# Patient Record
Sex: Male | Born: 1940 | Race: White | Hispanic: No | Marital: Single
Health system: Southern US, Community
[De-identification: ages and names within clinical notes are randomized; demographics above are authoritative.]

## PROBLEM LIST (undated history)

## (undated) DIAGNOSIS — J449 Chronic obstructive pulmonary disease, unspecified: Secondary | ICD-10-CM

## (undated) DIAGNOSIS — J849 Interstitial pulmonary disease, unspecified: Secondary | ICD-10-CM

## (undated) DIAGNOSIS — I5030 Unspecified diastolic (congestive) heart failure: Secondary | ICD-10-CM

## (undated) DIAGNOSIS — C189 Malignant neoplasm of colon, unspecified: Secondary | ICD-10-CM

## (undated) DIAGNOSIS — I4891 Unspecified atrial fibrillation: Secondary | ICD-10-CM

---

## 2015-05-31 ENCOUNTER — Inpatient Hospital Stay
Admission: RE | Admit: 2015-05-31 | Discharge: 2015-07-07 | Disposition: E | Payer: Self-pay | Source: Other Acute Inpatient Hospital | Attending: Internal Medicine | Admitting: Internal Medicine

## 2015-05-31 ENCOUNTER — Other Ambulatory Visit (HOSPITAL_COMMUNITY): Payer: Self-pay

## 2015-05-31 DIAGNOSIS — J189 Pneumonia, unspecified organism: Secondary | ICD-10-CM

## 2015-05-31 DIAGNOSIS — J9690 Respiratory failure, unspecified, unspecified whether with hypoxia or hypercapnia: Secondary | ICD-10-CM

## 2015-05-31 DIAGNOSIS — J9601 Acute respiratory failure with hypoxia: Secondary | ICD-10-CM | POA: Insufficient documentation

## 2015-05-31 DIAGNOSIS — J8410 Pulmonary fibrosis, unspecified: Secondary | ICD-10-CM | POA: Insufficient documentation

## 2015-05-31 HISTORY — DX: Interstitial pulmonary disease, unspecified: J84.9

## 2015-05-31 HISTORY — DX: Unspecified atrial fibrillation: I48.91

## 2015-05-31 HISTORY — DX: Malignant neoplasm of colon, unspecified: C18.9

## 2015-05-31 HISTORY — DX: Chronic obstructive pulmonary disease, unspecified: J44.9

## 2015-05-31 HISTORY — DX: Unspecified diastolic (congestive) heart failure: I50.30

## 2015-06-01 LAB — BLOOD GAS, ARTERIAL
ACID-BASE EXCESS: 5.7 mmol/L — AB (ref 0.0–2.0)
Bicarbonate: 29.1 mEq/L — ABNORMAL HIGH (ref 20.0–24.0)
DELIVERY SYSTEMS: POSITIVE
DRAWN BY: 345601
EXPIRATORY PAP: 8
FIO2: 0.7
INSPIRATORY PAP: 16
O2 SAT: 84.1 %
PCO2 ART: 36.1 mmHg (ref 35.0–45.0)
PH ART: 7.513 — AB (ref 7.350–7.450)
PO2 ART: 46.2 mmHg — AB (ref 80.0–100.0)
Patient temperature: 96.8
TCO2: 30.3 mmol/L (ref 0–100)

## 2015-06-01 LAB — CBC WITH DIFFERENTIAL/PLATELET
Basophils Absolute: 0 10*3/uL (ref 0.0–0.1)
Basophils Relative: 0 %
EOS ABS: 0 10*3/uL (ref 0.0–0.7)
EOS PCT: 0 %
HCT: 37.4 % — ABNORMAL LOW (ref 39.0–52.0)
Hemoglobin: 11.8 g/dL — ABNORMAL LOW (ref 13.0–17.0)
LYMPHS ABS: 0.7 10*3/uL (ref 0.7–4.0)
Lymphocytes Relative: 6 %
MCH: 29.3 pg (ref 26.0–34.0)
MCHC: 31.6 g/dL (ref 30.0–36.0)
MCV: 92.8 fL (ref 78.0–100.0)
Monocytes Absolute: 0.5 10*3/uL (ref 0.1–1.0)
Monocytes Relative: 4 %
Neutro Abs: 11 10*3/uL — ABNORMAL HIGH (ref 1.7–7.7)
Neutrophils Relative %: 90 %
PLATELETS: 231 10*3/uL (ref 150–400)
RBC: 4.03 MIL/uL — AB (ref 4.22–5.81)
RDW: 21.7 % — ABNORMAL HIGH (ref 11.5–15.5)
WBC: 12 10*3/uL — AB (ref 4.0–10.5)

## 2015-06-01 LAB — URINALYSIS, ROUTINE W REFLEX MICROSCOPIC
BILIRUBIN URINE: NEGATIVE
Glucose, UA: NEGATIVE mg/dL
HGB URINE DIPSTICK: NEGATIVE
KETONES UR: NEGATIVE mg/dL
Leukocytes, UA: NEGATIVE
Nitrite: NEGATIVE
PROTEIN: NEGATIVE mg/dL
Specific Gravity, Urine: 1.01 (ref 1.005–1.030)
pH: 7 (ref 5.0–8.0)

## 2015-06-01 LAB — TSH: TSH: 0.833 u[IU]/mL (ref 0.350–4.500)

## 2015-06-01 LAB — PROTIME-INR
INR: 1.3 (ref 0.00–1.49)
Prothrombin Time: 16.3 seconds — ABNORMAL HIGH (ref 11.6–15.2)

## 2015-06-01 LAB — BRAIN NATRIURETIC PEPTIDE: B NATRIURETIC PEPTIDE 5: 165.5 pg/mL — AB (ref 0.0–100.0)

## 2015-06-01 LAB — PROCALCITONIN

## 2015-06-02 LAB — COMPREHENSIVE METABOLIC PANEL
ALBUMIN: 3.2 g/dL — AB (ref 3.5–5.0)
ALK PHOS: 70 U/L (ref 38–126)
ALT: 29 U/L (ref 17–63)
AST: 34 U/L (ref 15–41)
Anion gap: 12 (ref 5–15)
BILIRUBIN TOTAL: 1.4 mg/dL — AB (ref 0.3–1.2)
BUN: 35 mg/dL — AB (ref 6–20)
CALCIUM: 8.8 mg/dL — AB (ref 8.9–10.3)
CO2: 30 mmol/L (ref 22–32)
Chloride: 99 mmol/L — ABNORMAL LOW (ref 101–111)
Creatinine, Ser: 0.79 mg/dL (ref 0.61–1.24)
GFR calc Af Amer: >60 mL/min (ref >60)
GLUCOSE: 180 mg/dL — AB (ref 65–99)
POTASSIUM: 4.1 mmol/L (ref 3.5–5.1)
Sodium: 141 mmol/L (ref 135–145)
TOTAL PROTEIN: 6.1 g/dL — AB (ref 6.5–8.1)

## 2015-06-02 LAB — MAGNESIUM: Magnesium: 2.5 mg/dL — ABNORMAL HIGH (ref 1.7–2.4)

## 2015-06-02 LAB — HEMOGLOBIN A1C
HEMOGLOBIN A1C: 5.5 % (ref 4.8–5.6)
MEAN PLASMA GLUCOSE: 111 mg/dL

## 2015-06-02 LAB — PHOSPHORUS: PHOSPHORUS: 3.7 mg/dL (ref 2.5–4.6)

## 2015-06-02 LAB — PROCALCITONIN: Procalcitonin: <0.1 ng/mL

## 2015-06-03 ENCOUNTER — Encounter: Payer: Self-pay | Admitting: Pulmonary Disease

## 2015-06-03 DIAGNOSIS — J9601 Acute respiratory failure with hypoxia: Secondary | ICD-10-CM

## 2015-06-03 DIAGNOSIS — J8410 Pulmonary fibrosis, unspecified: Secondary | ICD-10-CM | POA: Insufficient documentation

## 2015-06-03 NOTE — Consult Note (Signed)
Name: William Hensley MRN: [id number] DOB: 1940-10-24    ADMISSION DATE:  06/06/2015 CONSULTATION DATE:  06/03/2015  REFERRING MD :  Dr. Laren Everts  CHIEF COMPLAINT:  SOB  BRIEF PATIENT DESCRIPTION: 75 year old male with PMH interstitial lung disease managed by [facility]. [facility]. On 3L O2 at baseline, no chronic steroids. At baseline could walk ~40 feet before having to rest. Recently admitted to [facility]) for hypoxemic respiratory failure multifactoiral in setting COPD exacerbation, ILD flare, and volume overload. He required CPAP temporarily. He was treated with bronchodilators, steroids, and diuresis. Also short course of broad ABX to rule out CAP. Volume status improved, however, he remained severely hypoxemic despite continued nebs and steroids. He was transferred to [facility] for additional recovery.  SIGNIFICANT EVENTS  3/20 admit to [facility]  3/25 discharge to [facility] on [facility] 3/28 Pulmonary consult  STUDIES:    PAST MEDICAL HISTORY :   has a past medical history of COPD (chronic obstructive pulmonary disease) ([place]); ILD (interstitial lung disease) ([place]); Diastolic CHF ([place]); Atrial fibrillation ([place]); and Colon cancer ([place]).  has no past surgical history on file. Prior to Admission medications   Not on File   Allergies not on file  FAMILY HISTORY:  family history is not on file. SOCIAL HISTORY:    REVIEW OF SYSTEMS:   Bolds are positive  Constitutional: weight loss, gain, night sweats, Fevers, chills, fatigue .  HEENT: headaches, Sore throat, sneezing, nasal congestion, post nasal drip, Difficulty swallowing, Tooth/dental problems, visual complaints visual changes, ear ache CV:  chest pain, radiates: ,Orthopnea, PND, swelling in lower extremities, dizziness, palpitations, syncope.  GI  heartburn, indigestion, abdominal pain, nausea, vomiting, diarrhea, change in bowel habits, loss of appetite, bloody stools.  Resp: cough, productive: ,  hemoptysis, dyspnea, chest pain, pleuritic.  Skin: rash or itching or icterus GU: dysuria, change in color of urine, urgency or frequency. flank pain, hematuria  MS: joint pain or swelling. decreased range of motion  Psych: change in mood or affect. depression or anxiety.  Neuro: difficulty with speech, weakness, numbness, ataxia   SUBJECTIVE:   VITAL SIGNS:  P86, R 24, 82% on 100% FiO2 via HFNC  PHYSICAL EXAMINATION: General: Elderly male of normal body habitus in NAD at rest on 100% HFNC Neuro:  Alert, oriented, non-focal HEENT:  [place]/AT, PERRL, no JVD Cardiovascular:  RRR, no MRG Lungs:  Coarse bilateral breath sounds. Satting low 80s on 100% HFNC Abdomen:  Soft, non-tender, nondistended Musculoskeletal:  No acute deformity, trace if any lower extremity edema.  Skin:  Grossly intact   Recent Labs Lab 06/02/15 0530  NA 141  K 4.1  CL 99*  CO2 30  BUN 35*  CREATININE 0.79  GLUCOSE 180*    Recent Labs Lab 06/01/15 0544  HGB 11.8*  HCT 37.4*  WBC 12.0*  PLT 231   No results found.  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 Between COPD and ILD I fear that he may be at his new baseline and unable to recover his former ability to oxygenate. Despite aggressive diuresis, antibiotics, steroids, and bronchodilators he has been unable to improve. I think at this point he is a poor candidate for aggressive measures such as ACLS/intubation or other aggressive measures. It is unlikely that he would be able to wean off of ventilator in the setting of worsening respiratory failure or cardiac arrest. We will help to maximize his therapy. Mr.  Hensley seems to want to go home, I think this would be possible via home hospice assuming he can get HFNC at home.   Plan: - Continue HFNC with goal SpO2 88 - 92% (not able to reach this on 100% and 40LPM) - Continue budesonide, brovana, duoneb - Start systemic steroids - Continue  diuresis as tolerated - Further goals of care conversations  Georgann Housekeeper, AGACNP-BC [facility] Pulmonology/Critical Care Pager [phone] or [phone]  06/03/2015 4:43 PM

## 2015-06-04 ENCOUNTER — Other Ambulatory Visit (HOSPITAL_COMMUNITY): Payer: Self-pay

## 2015-06-04 LAB — PROCALCITONIN: Procalcitonin: 0.1 ng/mL

## 2015-06-05 DIAGNOSIS — K922 Gastrointestinal hemorrhage, unspecified: Secondary | ICD-10-CM | POA: Insufficient documentation

## 2015-06-05 DIAGNOSIS — I4891 Unspecified atrial fibrillation: Secondary | ICD-10-CM | POA: Insufficient documentation

## 2015-06-05 DIAGNOSIS — A0472 Enterocolitis due to Clostridium difficile, not specified as recurrent: Secondary | ICD-10-CM | POA: Insufficient documentation

## 2015-06-05 DIAGNOSIS — I25810 Atherosclerosis of coronary artery bypass graft(s) without angina pectoris: Secondary | ICD-10-CM | POA: Insufficient documentation

## 2015-06-05 DIAGNOSIS — N179 Acute kidney failure, unspecified: Secondary | ICD-10-CM | POA: Insufficient documentation

## 2015-06-05 DIAGNOSIS — N390 Urinary tract infection, site not specified: Secondary | ICD-10-CM | POA: Insufficient documentation

## 2015-06-05 DIAGNOSIS — J690 Pneumonitis due to inhalation of food and vomit: Secondary | ICD-10-CM | POA: Insufficient documentation

## 2015-06-05 DIAGNOSIS — Z515 Encounter for palliative care: Secondary | ICD-10-CM

## 2015-06-05 DIAGNOSIS — J810 Acute pulmonary edema: Secondary | ICD-10-CM

## 2015-06-05 DIAGNOSIS — R451 Restlessness and agitation: Secondary | ICD-10-CM | POA: Insufficient documentation

## 2015-06-05 LAB — COMPREHENSIVE METABOLIC PANEL
ALBUMIN: 2.8 g/dL — AB (ref 3.5–5.0)
ALK PHOS: 72 U/L (ref 38–126)
ALT: 22 U/L (ref 17–63)
ANION GAP: 11 (ref 5–15)
AST: 34 U/L (ref 15–41)
BUN: 33 mg/dL — ABNORMAL HIGH (ref 6–20)
CALCIUM: 8.7 mg/dL — AB (ref 8.9–10.3)
CHLORIDE: 97 mmol/L — AB (ref 101–111)
CO2: 29 mmol/L (ref 22–32)
Creatinine, Ser: 0.61 mg/dL (ref 0.61–1.24)
GFR calc non Af Amer: >60 mL/min (ref >60)
GLUCOSE: 109 mg/dL — AB (ref 65–99)
Potassium: 3.8 mmol/L (ref 3.5–5.1)
SODIUM: 137 mmol/L (ref 135–145)
Total Bilirubin: 1.7 mg/dL — ABNORMAL HIGH (ref 0.3–1.2)
Total Protein: 6 g/dL — ABNORMAL LOW (ref 6.5–8.1)

## 2015-06-05 LAB — PHOSPHORUS: Phosphorus: 3.5 mg/dL (ref 2.5–4.6)

## 2015-06-05 LAB — MAGNESIUM: MAGNESIUM: 2.6 mg/dL — AB (ref 1.7–2.4)

## 2015-06-05 NOTE — Progress Notes (Signed)
Name: William Hensley MRN: [id number] DOB: November 08, 1940    ADMISSION DATE:  05/30/2015 CONSULTATION DATE:  06/03/2015  REFERRING MD :  Dr. Laren Everts  CHIEF COMPLAINT:  SOB  BRIEF PATIENT DESCRIPTION: 75 year old male with PMH interstitial lung disease managed by [facility]. On 3L O2 at baseline, no chronic steroids. At baseline could walk ~40 feet before having to rest. Recently admitted to [facility]) for hypoxemic respiratory failure multifactoiral in setting COPD exacerbation, ILD flare, and volume overload. He required CPAP temporarily. He was treated with bronchodilators, steroids, and diuresis. Also short course of broad ABX to rule out CAP. Volume status improved, however, he remained severely hypoxemic despite continued nebs and steroids. He was transferred to [facility] for additional recovery.  SIGNIFICANT EVENTS  3/20 admit to [facility]  3/25 discharge to [facility] on [facility] 3/28 Pulmonary consult 3/30 on 100% fio2 nimvs STUDIES:     SUBJECTIVE:  Awake and alert VITAL SIGNS:  P76, R 24, 92% on 100% FiO2 via NIMVS   PHYSICAL EXAMINATION: General: Elderly male of normal body habitus in NAD at rest on 100% HFNC Neuro:  Alert, oriented, non-focal HEENT:  [place]/AT, PERRL, no JVD Cardiovascular:  RRR, no MRG Lungs:  Coarse bilateral breath sounds. Satting low 80s on 100% HFNC Abdomen:  Soft, non-tender, nondistended Musculoskeletal:  No acute deformity, trace if any lower extremity edema.  Skin:  Grossly intact   Recent Labs Lab 06/02/15 0530 06/05/15 0648  NA 141 137  K 4.1 3.8  CL 99* 97*  CO2 30 29  BUN 35* 33*  CREATININE 0.79 0.61  GLUCOSE 180* 109*    Recent Labs Lab 06/01/15 0544  HGB 11.8*  HCT 37.4*  WBC 12.0*  PLT 231   Dg Chest Port 1 View  06/04/2015  CLINICAL DATA:  Admitted 4 days ago with respiratory failure. EXAM: PORTABLE CHEST 1 VIEW COMPARISON:  05/08/2015 FINDINGS: Bibasilar coarse interstitial opacities.  Advanced apical emphysema. Borderline cardiomegaly with negative aortic and hilar contours. No effusion or air leak. IMPRESSION: *Basilar interstitial opacities may be related to patient's history of pulmonary fibrosis but bilateral pneumonia could be present/superimposed. *Emphysema. Electronically Signed   By: Monte Fantasia M.D.   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 Between COPD and ILD I fear that he may be at his new baseline and unable to recover his former ability to oxygenate. Despite aggressive diuresis, antibiotics, steroids, and bronchodilators he has been unable to improve. I think at this point he is a poor candidate for aggressive measures such as ACLS/intubation or other aggressive measures. It is unlikely that he would be able to wean off of ventilator in the setting of worsening respiratory failure or cardiac arrest. We will help to maximize his therapy. William Hensley seems to want to go home, I think this would be possible via home hospice assuming he can get HFNC at home.   Plan: - Continue HFNC with goal SpO2 88 - 92% (not able to reach this on 100% and 40LPM) - Continue budesonide, brovana, duoneb - Start systemic steroids - Continue diuresis as tolerated - Further goals of care conversations!!!  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  On exam, diffuse rales, moderate resp distress.  I reviewed CXR myself, ILD noted with burnt out fibrosis.  Discussed with [facility]-MD and RT.  ES-Sarcoid:  - Continue budesonide, brovana and duonebs.  - Systemic steroids.  Hypoxemia respiratory failure:  - HFNC.  - Titrate O2 for sat of 88-92%.  - No intubation for now.  - BiPAP if needed.  Pulmonary edema:  -  Diureses.  - Maintain dry as able.  GOC:  - Needs palliation, if this patient is intubated he will never be extubated.  Patient seen and examined, agree with above note.  I dictated the care and orders written for this patient under my direction.  Rush Farmer, MD [phone]

## 2015-06-07 LAB — EXPECTORATED SPUTUM ASSESSMENT W GRAM STAIN, RFLX TO RESP C

## 2015-06-07 LAB — EXPECTORATED SPUTUM ASSESSMENT W REFEX TO RESP CULTURE

## 2015-06-07 DEATH — deceased

## 2015-06-09 NOTE — Progress Notes (Signed)
   Name: William Hensley MRN: [id number] DOB: 1941/02/18    ADMISSION DATE:  06/01/2015 CONSULTATION DATE:  06/03/2015  REFERRING MD :  Dr. Laren Everts  CHIEF COMPLAINT:  SOB  BRIEF PATIENT DESCRIPTION: 75 year old male with PMH interstitial lung disease managed by [facility]. On 3L O2 at baseline, no chronic steroids. At baseline could walk ~40 feet before having to rest. Recently admitted to OSH [facility]) for hypoxemic respiratory failure multifactoiral in setting COPD exacerbation, ILD flare, and volume overload. He required CPAP temporarily. He was treated with bronchodilators, steroids, and diuresis. Also short course of broad ABX to rule out CAP. Volume status improved, however, he remained severely hypoxemic despite continued nebs and steroids. He was transferred to [facility] for additional recovery.  SIGNIFICANT EVENTS  3/20 admit to [facility]  3/25 discharge to [facility] on [facility] 3/28 Pulmonary consult 3/30 on 100% fio2 nimvs STUDIES:     SUBJECTIVE:  Awake and alert VITAL SIGNS:  P76, R 24, 92% on 100% FiO2 via NIMVS   PHYSICAL EXAMINATION: General: Elderly male of normal body habitus in NAD at rest on 100% HFNC Neuro:  Alert, oriented, non-focal HEENT:  [place]/AT, PERRL, no JVD Cardiovascular:  RRR, no MRG Lungs:  Coarse bilateral breath sounds. Satting low 80s on 100% HFNC Abdomen:  Soft, non-tender, nondistended Musculoskeletal:  No acute deformity, trace if any lower extremity edema.  Skin:  Grossly intact   Recent Labs Lab 06/05/15 0648  NA 137  K 3.8  CL 97*  CO2 29  BUN 33*  CREATININE 0.61  GLUCOSE 109*   No results for input(s): HGB, HCT, WBC, PLT in the last 168 hours. No results found.  ASSESSMENT / PLAN:  Chronic hypoxemic respiratory failure - [name] has end stage lung disease. Between COPD and ILD I fear that he may be at his new baseline and unable to recover his former ability to oxygenate. Despite aggressive  diuresis, antibiotics, steroids, and bronchodilators he has been unable to improve. I think at this point he is a poor candidate for aggressive measures such as ACLS/intubation or other aggressive measures. It is unlikely that he would be able to wean off of ventilator in the setting of worsening respiratory failure or cardiac arrest. We will help to maximize his therapy. [name] seems to want to go home, I think this would be possible via home hospice assuming he can get HFNC at home.   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  On exam, diffuse rales, moderate resp distress.  I reviewed CXR myself, ILD noted with burnt out fibrosis.  Discussed with [facility]-MD and RT.  ES-Sarcoid:  - Continue budesonide, brovana and duonebs.  - Systemic steroids.  Hypoxemia respiratory failure:  - HFNC during the day.  - Titrate O2 for sat of 88-92%.  - DNR status.  - BiPAP QHS to assist with work of breathing.  Pulmonary edema:  - Diureses.  - Maintain dry as able.  GOC:  - Hospice discharge today.  PCCM will sign off, please call back if needed.  Rush Farmer, M.D. [facility] Pulmonary/Critical Care Medicine. Pager: [phone]. After hours pager: [phone].

## 2015-06-10 LAB — EXPECTORATED SPUTUM ASSESSMENT W REFEX TO RESP CULTURE

## 2015-06-10 LAB — CBC
HEMATOCRIT: 40.7 % (ref 39.0–52.0)
HEMOGLOBIN: 12.7 g/dL — AB (ref 13.0–17.0)
MCH: 29.2 pg (ref 26.0–34.0)
MCHC: 31.2 g/dL (ref 30.0–36.0)
MCV: 93.6 fL (ref 78.0–100.0)
Platelets: 296 10*3/uL (ref 150–400)
RBC: 4.35 MIL/uL (ref 4.22–5.81)
RDW: 20.2 % — ABNORMAL HIGH (ref 11.5–15.5)
WBC: 20 10*3/uL — ABNORMAL HIGH (ref 4.0–10.5)

## 2015-06-10 LAB — BASIC METABOLIC PANEL
Anion gap: 14 (ref 5–15)
BUN: 32 mg/dL — ABNORMAL HIGH (ref 6–20)
CHLORIDE: 97 mmol/L — AB (ref 101–111)
CO2: 29 mmol/L (ref 22–32)
Calcium: 9 mg/dL (ref 8.9–10.3)
Creatinine, Ser: 0.63 mg/dL (ref 0.61–1.24)
GFR calc non Af Amer: >60 mL/min (ref >60)
Glucose, Bld: 99 mg/dL (ref 65–99)
POTASSIUM: 3.7 mmol/L (ref 3.5–5.1)
SODIUM: 140 mmol/L (ref 135–145)

## 2015-06-10 LAB — CULTURE, RESPIRATORY

## 2015-06-10 LAB — CULTURE, RESPIRATORY W GRAM STAIN

## 2015-06-10 LAB — EXPECTORATED SPUTUM ASSESSMENT W GRAM STAIN, RFLX TO RESP C

## 2015-06-12 ENCOUNTER — Other Ambulatory Visit (HOSPITAL_COMMUNITY): Payer: Self-pay

## 2015-06-12 LAB — CBC WITH DIFFERENTIAL/PLATELET
BASOS PCT: 0 %
Basophils Absolute: 0 10*3/uL (ref 0.0–0.1)
EOS ABS: 0 10*3/uL (ref 0.0–0.7)
Eosinophils Relative: 0 %
HCT: 39.4 % (ref 39.0–52.0)
Hemoglobin: 12.4 g/dL — ABNORMAL LOW (ref 13.0–17.0)
LYMPHS ABS: 0.8 10*3/uL (ref 0.7–4.0)
Lymphocytes Relative: 8 %
MCH: 29.3 pg (ref 26.0–34.0)
MCHC: 31.5 g/dL (ref 30.0–36.0)
MCV: 93.1 fL (ref 78.0–100.0)
MONO ABS: 0.4 10*3/uL (ref 0.1–1.0)
MONOS PCT: 4 %
Neutro Abs: 8.8 10*3/uL — ABNORMAL HIGH (ref 1.7–7.7)
Neutrophils Relative %: 88 %
Platelets: 279 10*3/uL (ref 150–400)
RBC: 4.23 MIL/uL (ref 4.22–5.81)
RDW: 20.4 % — AB (ref 11.5–15.5)
WBC: 10 10*3/uL (ref 4.0–10.5)

## 2015-06-12 LAB — BASIC METABOLIC PANEL
Anion gap: 17 — ABNORMAL HIGH (ref 5–15)
BUN: 32 mg/dL — AB (ref 6–20)
CALCIUM: 9.1 mg/dL (ref 8.9–10.3)
CO2: 27 mmol/L (ref 22–32)
CREATININE: 0.59 mg/dL — AB (ref 0.61–1.24)
Chloride: 94 mmol/L — ABNORMAL LOW (ref 101–111)
GFR calc non Af Amer: >60 mL/min (ref >60)
Glucose, Bld: 145 mg/dL — ABNORMAL HIGH (ref 65–99)
Potassium: 4 mmol/L (ref 3.5–5.1)
SODIUM: 138 mmol/L (ref 135–145)

## 2015-06-15 ENCOUNTER — Other Ambulatory Visit (HOSPITAL_COMMUNITY): Payer: Self-pay

## 2015-06-15 LAB — BASIC METABOLIC PANEL
ANION GAP: 11 (ref 5–15)
BUN: 32 mg/dL — ABNORMAL HIGH (ref 6–20)
CHLORIDE: 105 mmol/L (ref 101–111)
CO2: 24 mmol/L (ref 22–32)
Calcium: 9.2 mg/dL (ref 8.9–10.3)
Creatinine, Ser: 0.58 mg/dL — ABNORMAL LOW (ref 0.61–1.24)
Glucose, Bld: 199 mg/dL — ABNORMAL HIGH (ref 65–99)
POTASSIUM: 5 mmol/L (ref 3.5–5.1)
SODIUM: 140 mmol/L (ref 135–145)

## 2015-06-15 LAB — CBC
HCT: 39 % (ref 39.0–52.0)
HEMOGLOBIN: 12 g/dL — AB (ref 13.0–17.0)
MCH: 29.3 pg (ref 26.0–34.0)
MCHC: 30.8 g/dL (ref 30.0–36.0)
MCV: 95.4 fL (ref 78.0–100.0)
Platelets: 226 10*3/uL (ref 150–400)
RBC: 4.09 MIL/uL — AB (ref 4.22–5.81)
RDW: 20.3 % — ABNORMAL HIGH (ref 11.5–15.5)
WBC: 30.6 10*3/uL — AB (ref 4.0–10.5)

## 2015-06-17 ENCOUNTER — Other Ambulatory Visit (HOSPITAL_COMMUNITY): Payer: Self-pay

## 2015-06-23 NOTE — Code Documentation (Signed)
Delayed entry   Patient Name: William Hensley   MRN: [id number]   Date of Birth/ Sex: 11-01-1940 , male      Admission Date: 05/20/2015  Attending Provider: No att. providers found  Primary Diagnosis: ltc   Indication: Pt was in his usual state of health until this PM, when he was noted to be in asystole. Code blue was subsequently called. At the time of arrival on scene, staff informed code team that patient is limited code status - pharmacotherapy allowed, no chest compressions, no intubation, no defibrillation or cardioversion.   Technical Description:  - CPR performance duration:  0  minutes  - Was defibrillation or cardioversion used? No   - Was external pacer placed? No  - Was patient intubated pre/post CPR? No   Medications Administered: Y = Yes; Blank = No Amiodarone    Atropine    Calcium  Y  Epinephrine  Y  Lidocaine    Magnesium    Norepinephrine    Phenylephrine    Sodium bicarbonate  Y  Vasopressin    Other    Post CPR evaluation:  - Final Status - Was patient successfully resuscitated ? No   Miscellaneous Information:  - Time of death:  See paper chart  - Primary team notified?  Yes - by staff at [place] Notified? Yes - by staff at [place], MD   06/23/2015, 5:40 PM

## 2015-06-24 MED FILL — Medication: Qty: 1 | Status: AC

## 2015-07-07 DEATH — deceased

## 2018-01-09 IMAGING — DX DG CHEST 1V PORT
1 series · 1 of 1 positions shown · non-contrast
Comparison: Portable chest x-ray June 15, 2015

CLINICAL DATA: Pneumonia, history of pulmonary fibrosis, now acute
respiratory failure

EXAM:
PORTABLE CHEST 1 VIEW

[chest ap]
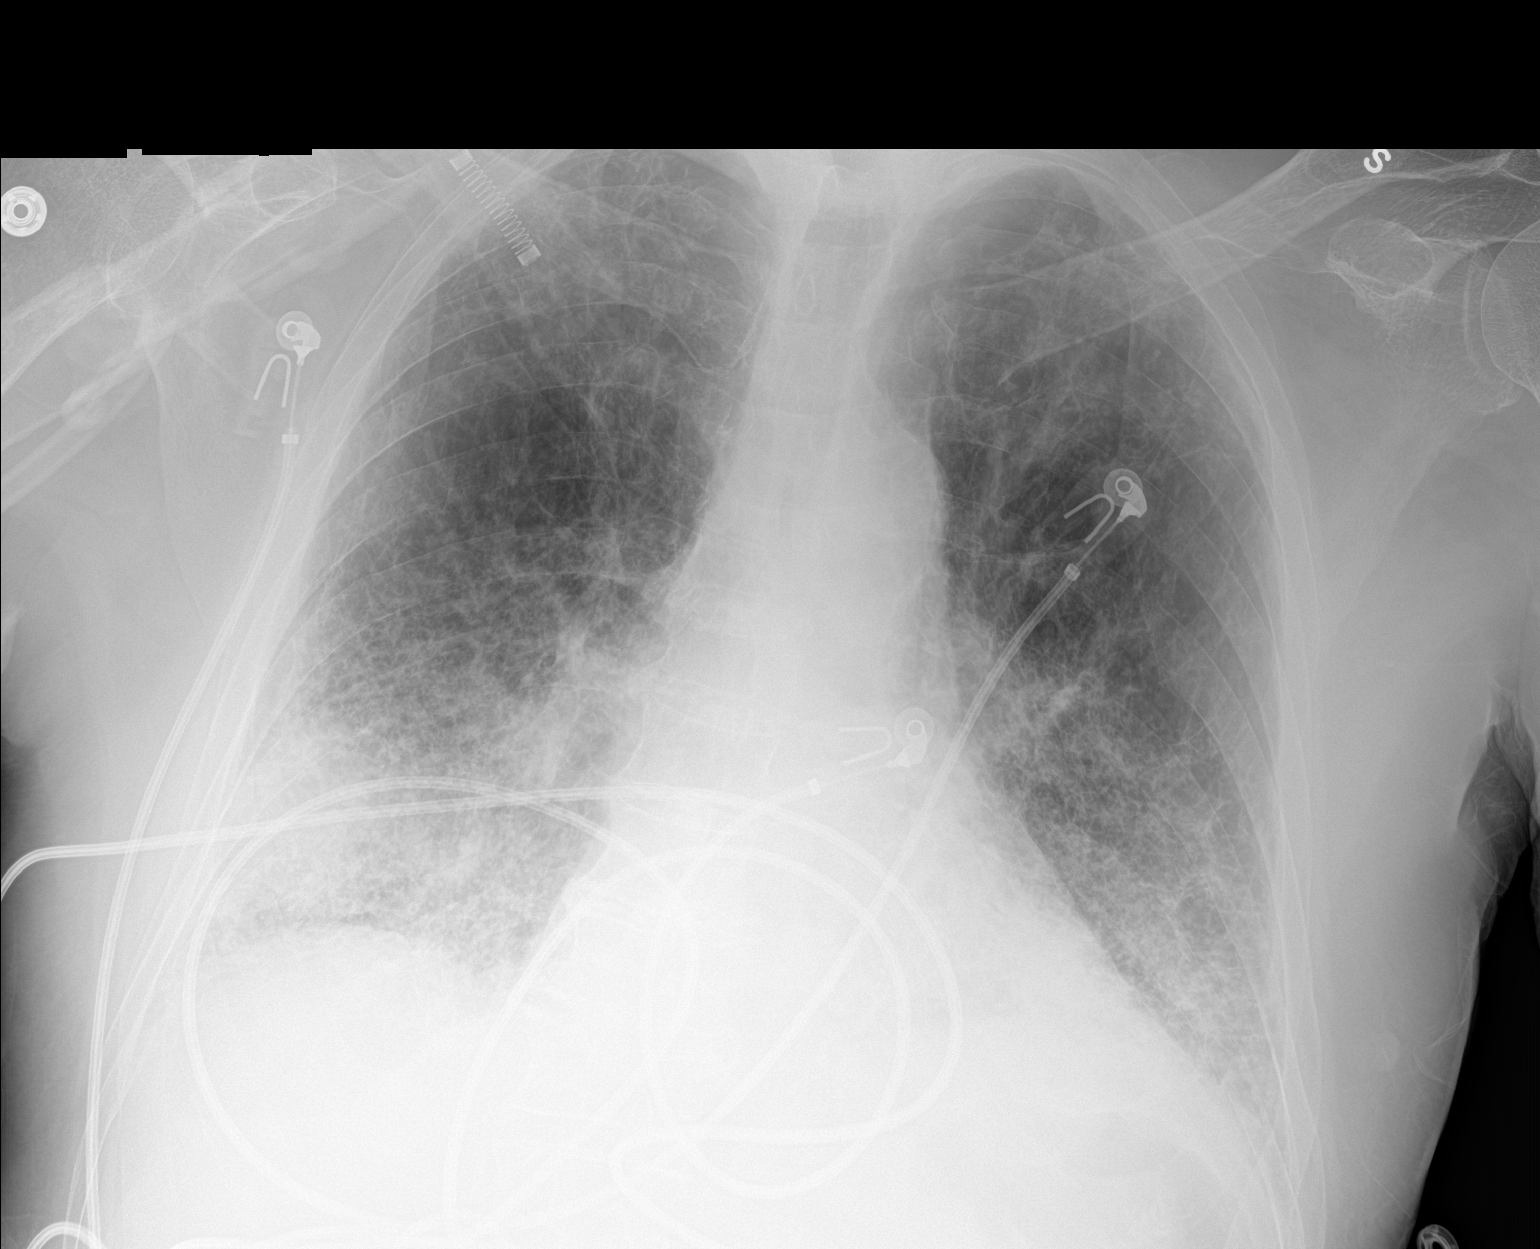

[1 of 1 positions shown; findings below may reference images not displayed]

FINDINGS: The lungs are well-expanded. A interstitial -reticular pattern in
the mid and lower lungs is stable. There is partial obscuration of
the left hemidiaphragm. There is stable apical pleural scarring. The
heart is normal in size. The pulmonary vascularity is not engorged.
There is moderate dextrocurvature centered at the thoracolumbar
junction.
IMPRESSION: Stable appearance of the chest since yesterday's study. Bibasilar
interstitial pneumonia superimposed upon underlying pulmonary
fibrosis and COPD.
